# Patient Record
Sex: Female | Born: 1965 | Race: White | Hispanic: No
Health system: Southern US, Community
[De-identification: ages and names within clinical notes are randomized; demographics above are authoritative.]

---

## 2017-01-20 ENCOUNTER — Emergency Department (HOSPITAL_COMMUNITY): Payer: Medicare Other

## 2017-01-20 ENCOUNTER — Emergency Department (HOSPITAL_COMMUNITY)
Admission: EM | Admit: 2017-01-20 | Discharge: 2017-01-20 | Disposition: A | Discharge status: Home / Self Care | Payer: Medicare Other | Attending: Emergency Medicine | Admitting: Emergency Medicine

## 2017-01-20 DIAGNOSIS — Z79899 Other long term (current) drug therapy: Secondary | ICD-10-CM | POA: Insufficient documentation

## 2017-01-20 DIAGNOSIS — J40 Bronchitis, not specified as acute or chronic: Secondary | ICD-10-CM | POA: Insufficient documentation

## 2017-01-20 DIAGNOSIS — R05 Cough: Secondary | ICD-10-CM | POA: Diagnosis present

## 2017-01-20 LAB — URINALYSIS, ROUTINE W REFLEX MICROSCOPIC
Bilirubin Urine: NEGATIVE
Glucose, UA: NEGATIVE mg/dL
Ketones, ur: NEGATIVE mg/dL
LEUKOCYTES UA: NEGATIVE
Nitrite: NEGATIVE
PH: 6 (ref 5.0–8.0)
Protein, ur: NEGATIVE mg/dL
SPECIFIC GRAVITY, URINE: 1.01 (ref 1.005–1.030)

## 2017-01-20 LAB — CBC WITH DIFFERENTIAL/PLATELET
BASOS PCT: 0 %
Basophils Absolute: 0 10*3/uL (ref 0.0–0.1)
Eosinophils Absolute: 0.1 10*3/uL (ref 0.0–0.7)
Eosinophils Relative: 1 %
HEMATOCRIT: 44.6 % (ref 36.0–46.0)
HEMOGLOBIN: 14.7 g/dL (ref 12.0–15.0)
LYMPHS ABS: 2.3 10*3/uL (ref 0.7–4.0)
Lymphocytes Relative: 19 %
MCH: 27.2 pg (ref 26.0–34.0)
MCHC: 33 g/dL (ref 30.0–36.0)
MCV: 82.4 fL (ref 78.0–100.0)
MONOS PCT: 10 %
Monocytes Absolute: 1.2 10*3/uL — ABNORMAL HIGH (ref 0.1–1.0)
NEUTROS ABS: 8.5 10*3/uL — AB (ref 1.7–7.7)
NEUTROS PCT: 70 %
Platelets: 251 10*3/uL (ref 150–400)
RBC: 5.41 MIL/uL — AB (ref 3.87–5.11)
RDW: 16.4 % — ABNORMAL HIGH (ref 11.5–15.5)
WBC: 12 10*3/uL — AB (ref 4.0–10.5)

## 2017-01-20 LAB — COMPREHENSIVE METABOLIC PANEL
ALBUMIN: 3.5 g/dL (ref 3.5–5.0)
ALK PHOS: 84 U/L (ref 38–126)
ALT: 24 U/L (ref 14–54)
ANION GAP: 8 (ref 5–15)
AST: 19 U/L (ref 15–41)
BUN: 12 mg/dL (ref 6–20)
CALCIUM: 9 mg/dL (ref 8.9–10.3)
CO2: 31 mmol/L (ref 22–32)
CREATININE: 0.89 mg/dL (ref 0.44–1.00)
Chloride: 101 mmol/L (ref 101–111)
GFR calc Af Amer: >60 mL/min (ref >60)
GFR calc non Af Amer: >60 mL/min (ref >60)
GLUCOSE: 113 mg/dL — AB (ref 65–99)
Potassium: 3.3 mmol/L — ABNORMAL LOW (ref 3.5–5.1)
Sodium: 140 mmol/L (ref 135–145)
Total Bilirubin: 0.5 mg/dL (ref 0.3–1.2)
Total Protein: 7 g/dL (ref 6.5–8.1)

## 2017-01-20 MED ORDER — ALBUTEROL SULFATE (2.5 MG/3ML) 0.083% IN NEBU
5.0000 mg | INHALATION_SOLUTION | Freq: Once | RESPIRATORY_TRACT | Status: AC
  Administered 2017-01-20: 5 mg via RESPIRATORY_TRACT
  Filled 2017-01-20: qty 6

## 2017-01-20 MED ORDER — ALBUTEROL SULFATE (2.5 MG/3ML) 0.083% IN NEBU: 2.5000 mg | INHALATION_SOLUTION | Freq: Once | RESPIRATORY_TRACT | Status: DC

## 2017-01-20 MED ORDER — PREDNISONE 10 MG PO TABS: 20.0000 mg | ORAL_TABLET | Freq: Every day | ORAL | 0 refills | Status: AC

## 2017-01-20 MED ORDER — HYDROCODONE-ACETAMINOPHEN 5-325 MG PO TABS
1.0000 | ORAL_TABLET | Freq: Once | ORAL | Status: AC
  Administered 2017-01-20: 1 via ORAL
  Filled 2017-01-20: qty 1

## 2017-01-20 MED ORDER — SULFAMETHOXAZOLE-TRIMETHOPRIM 800-160 MG PO TABS: 1.0000 | ORAL_TABLET | Freq: Two times a day (BID) | ORAL | 0 refills | Status: AC

## 2017-01-20 MED ORDER — IPRATROPIUM-ALBUTEROL 0.5-2.5 (3) MG/3ML IN SOLN
3.0000 mL | Freq: Once | RESPIRATORY_TRACT | Status: AC
  Administered 2017-01-20: 3 mL via RESPIRATORY_TRACT
  Filled 2017-01-20: qty 3

## 2017-01-20 MED ORDER — PREDNISONE 50 MG PO TABS
60.0000 mg | ORAL_TABLET | Freq: Once | ORAL | Status: AC
  Administered 2017-01-20: 60 mg via ORAL
  Filled 2017-01-20: qty 1

## 2017-01-20 NOTE — ED Notes (Signed)
Lab at bedside

## 2017-01-20 NOTE — ED Notes (Signed)
ED Provider at bedside. 

## 2017-01-20 NOTE — ED Triage Notes (Signed)
Pt reports that she has been coughing and congested for one week. Pt reports that she has mold in her house and it was treated last with bleach and since exposure she has been sick Chest and ribs hurt from coughing. Pt also reports that she has only voided x 2 and it burns

## 2017-01-20 NOTE — Discharge Instructions (Signed)
Continue use your inhaler as prescribed. Follow-up with your doctor next week as planned.

## 2017-01-20 NOTE — ED Provider Notes (Signed)
[facility] Provider Note   CSN: [id number] Arrival date & time: 01/20/17  1306     History   Chief Complaint Chief Complaint  Patient presents with  . Cough    HPI Teresa Andrade is a 51 y.o. female.  Patient complains of cough for a number days. She also complains of some shortness of breath   The history is provided by the patient. No language interpreter was used.  Cough  This is a recurrent problem. The current episode started more than 2 days ago. The problem occurs constantly. The problem has not changed since onset.The cough is non-productive. The maximum temperature recorded prior to her arrival was 103 to 104 F. Pertinent negatives include no chest pain and no headaches. She has tried nothing for the symptoms. The treatment provided no relief. Risk factors include chemical exposure. She is a smoker. Her past medical history does not include bronchiectasis.    No past medical history on file.  There are no active problems to display for this patient.   No past surgical history on file.  OB History    No data available       Home Medications    Prior to Admission medications   Medication Sig Start Date End Date Taking? Authorizing Provider  albuterol (PROVENTIL HFA;VENTOLIN HFA) 108 (90 Base) MCG/ACT inhaler Inhale 1-2 puffs into the lungs every 6 (six) hours as needed for wheezing or shortness of breath.   Yes [provider]  buprenorphine (SUBUTEX) 8 MG SUBL SL tablet See admin instructions. Place one tablet under tongue 3 times daily & 1/2 tablet at bedtime 01/07/17  Yes [provider]  clonazePAM (KLONOPIN) 1 MG tablet Take 1 mg by mouth 3 (three) times daily. 01/07/17  Yes [provider]  DULoxetine (CYMBALTA) 60 MG capsule Take 2 capsules by mouth daily. 01/19/17  Yes [provider]  furosemide (LASIX) 40 MG tablet Take 1 tablet by mouth daily.   Yes [provider]  gabapentin (NEURONTIN) 800 MG tablet  Take 800 mg by mouth 4 (four) times daily. 01/19/17  Yes [provider]  hydrOXYzine (VISTARIL) 50 MG capsule Take 1 capsule by mouth See admin instructions. 1 capsule three times daily and 2 capsules at bedtim 01/19/17  Yes [provider]  IBU 800 MG tablet Take 800 mg by mouth 3 (three) times daily. 01/19/17  Yes [provider]  lubiprostone (AMITIZA) 24 MCG capsule Take 1 capsule by mouth 2 (two) times daily.   Yes [provider]  LYRICA 300 MG capsule Take 300 mg by mouth 2 (two) times daily. 01/19/17  Yes [provider]  mirtazapine (REMERON) 30 MG tablet Take 60 mg by mouth at bedtime. 01/19/17  Yes [provider]  montelukast (SINGULAIR) 10 MG tablet Take 10 mg by mouth at bedtime.   Yes [provider]  VRAYLAR 1.5 MG CAPS Take 1 capsule by mouth at bedtime. 01/14/17  Yes [provider]  VRAYLAR 3 MG CAPS Take 1 capsule by mouth at bedtime. 01/19/17  Yes [provider]  Cholecalciferol (VITAMIN D) 2000 units tablet Take 1 tablet by mouth daily.    [provider]  dicyclomine (BENTYL) 20 MG tablet Take 1 tablet by mouth 3 (three) times daily.    [provider]  potassium chloride SA (K-DUR,KLOR-CON) 20 MEQ tablet Take 1 tablet by mouth daily as needed.    [provider]  pravastatin (PRAVACHOL) 40 MG tablet Take 1 tablet by  mouth at bedtime.    [provider]  predniSONE (DELTASONE) 10 MG tablet Take 2 tablets (20 mg total) by mouth daily. 01/20/17   Bethann BerkshireZammit, Vicki Pasqual, MD  sulfamethoxazole-trimethoprim (BACTRIM DS,SEPTRA DS) 800-160 MG tablet Take 1 tablet by mouth 2 (two) times daily. 01/20/17 01/27/17  Bethann BerkshireZammit, Shanae Luo, MD    Family History No family history on file.  Social History Social History  Substance Use Topics  . Smoking status: Not on file  . Smokeless tobacco: Not on file  . Alcohol use Not on file     Allergies   Codeine   Review of Systems Review of Systems   Constitutional: Negative for appetite change and fatigue.  HENT: Negative for congestion, ear discharge and sinus pressure.   Eyes: Negative for discharge.  Respiratory: Positive for cough.   Cardiovascular: Negative for chest pain.  Gastrointestinal: Negative for abdominal pain and diarrhea.  Genitourinary: Negative for frequency and hematuria.  Musculoskeletal: Negative for back pain.  Skin: Negative for rash.  Neurological: Negative for seizures and headaches.  Psychiatric/Behavioral: Negative for hallucinations.     Physical Exam Updated Vital Signs BP 136/77   Pulse 82   Temp 98.7 F (37.1 C) (Oral)   Resp 18   Wt 130.4 kg (287 lb 7 oz)   SpO2 92%   Physical Exam  Constitutional: She is oriented to person, place, and time. She appears well-developed.  HENT:  Head: Normocephalic.  Eyes: Conjunctivae and EOM are normal. No scleral icterus.  Neck: Neck supple. No thyromegaly present.  Cardiovascular: Normal rate and regular rhythm.  Exam reveals no gallop and no friction rub.   No murmur heard. Pulmonary/Chest: No stridor. She has wheezes. She has no rales. She exhibits no tenderness.  Abdominal: She exhibits no distension. There is no tenderness. There is no rebound.  Musculoskeletal: Normal range of motion. She exhibits no edema.  Lymphadenopathy:    She has no cervical adenopathy.  Neurological: She is oriented to person, place, and time. She exhibits normal muscle tone. Coordination normal.  Skin: No rash noted. No erythema.  Psychiatric: She has a normal mood and affect. Her behavior is normal.     ED Treatments / Results  Labs (all labs ordered are listed, but only abnormal results are displayed) Labs Reviewed  CBC WITH DIFFERENTIAL/PLATELET - Abnormal; Notable for the following:       Result Value   WBC 12.0 (*)    RBC 5.41 (*)    RDW 16.4 (*)    Neutro Abs 8.5 (*)    Monocytes Absolute 1.2 (*)    All other components within normal limits    COMPREHENSIVE METABOLIC PANEL - Abnormal; Notable for the following:    Potassium 3.3 (*)    Glucose, Bld 113 (*)    All other components within normal limits  URINALYSIS, ROUTINE W REFLEX MICROSCOPIC - Abnormal; Notable for the following:    Hgb urine dipstick SMALL (*)    Bacteria, UA RARE (*)    Squamous Epithelial / LPF 0-5 (*)    All other components within normal limits    EKG  EKG Interpretation None       Radiology Dg Chest 2 View  Result Date: 01/20/2017 CLINICAL DATA:  Cough and congested. EXAM: CHEST  2 VIEW COMPARISON:  No recent prior . FINDINGS: Mediastinum hilar structures normal. Low lung volumes with mild basilar atelectasis. No pleural effusion or pneumothorax. Heart size normal. No acute bony abnormality . IMPRESSION: Low lung volumes with mild  basilar atelectasis. Electronically Signed   By: Maisie Fus  Register   On: 01/20/2017 14:02    Procedures Procedures (including critical care time)  Medications Ordered in ED Medications  albuterol (PROVENTIL) (2.5 MG/3ML) 0.083% nebulizer solution 5 mg (5 mg Nebulization Given 01/20/17 1405)  predniSONE (DELTASONE) tablet 60 mg (60 mg Oral Given 01/20/17 1430)  ipratropium-albuterol (DUONEB) 0.5-2.5 (3) MG/3ML nebulizer solution 3 mL (3 mLs Nebulization Given 01/20/17 1405)  HYDROcodone-acetaminophen (NORCO/VICODIN) 5-325 MG per tablet 1 tablet (1 tablet Oral Given 01/20/17 1512)     Initial Impression / Assessment and Plan / ED Course  I have reviewed the triage vital signs and the nursing notes.  Pertinent labs & imaging results that were available during my care of the patient were reviewed by me and considered in my medical decision making (see chart for details).     Patient improved with neb treatment. Patient has bronchitis and bronchospasm she'll be treated with Bactrim and prednisone will follow-up with her PCP next week  Final Clinical Impressions(s) / ED Diagnoses   Final diagnoses:  Bronchitis    New  Prescriptions New Prescriptions   PREDNISONE (DELTASONE) 10 MG TABLET    Take 2 tablets (20 mg total) by mouth daily.   SULFAMETHOXAZOLE-TRIMETHOPRIM (BACTRIM DS,SEPTRA DS) 800-160 MG TABLET    Take 1 tablet by mouth 2 (two) times daily.     Bethann Berkshire, MD 01/20/17 [phone]

## 2018-04-23 IMAGING — DX DG CHEST 2V
2 series · 2 of 2 positions shown · non-contrast
Comparison: No recent prior .

CLINICAL DATA: Cough and congested.

EXAM:
CHEST  2 VIEW

[chest pa]
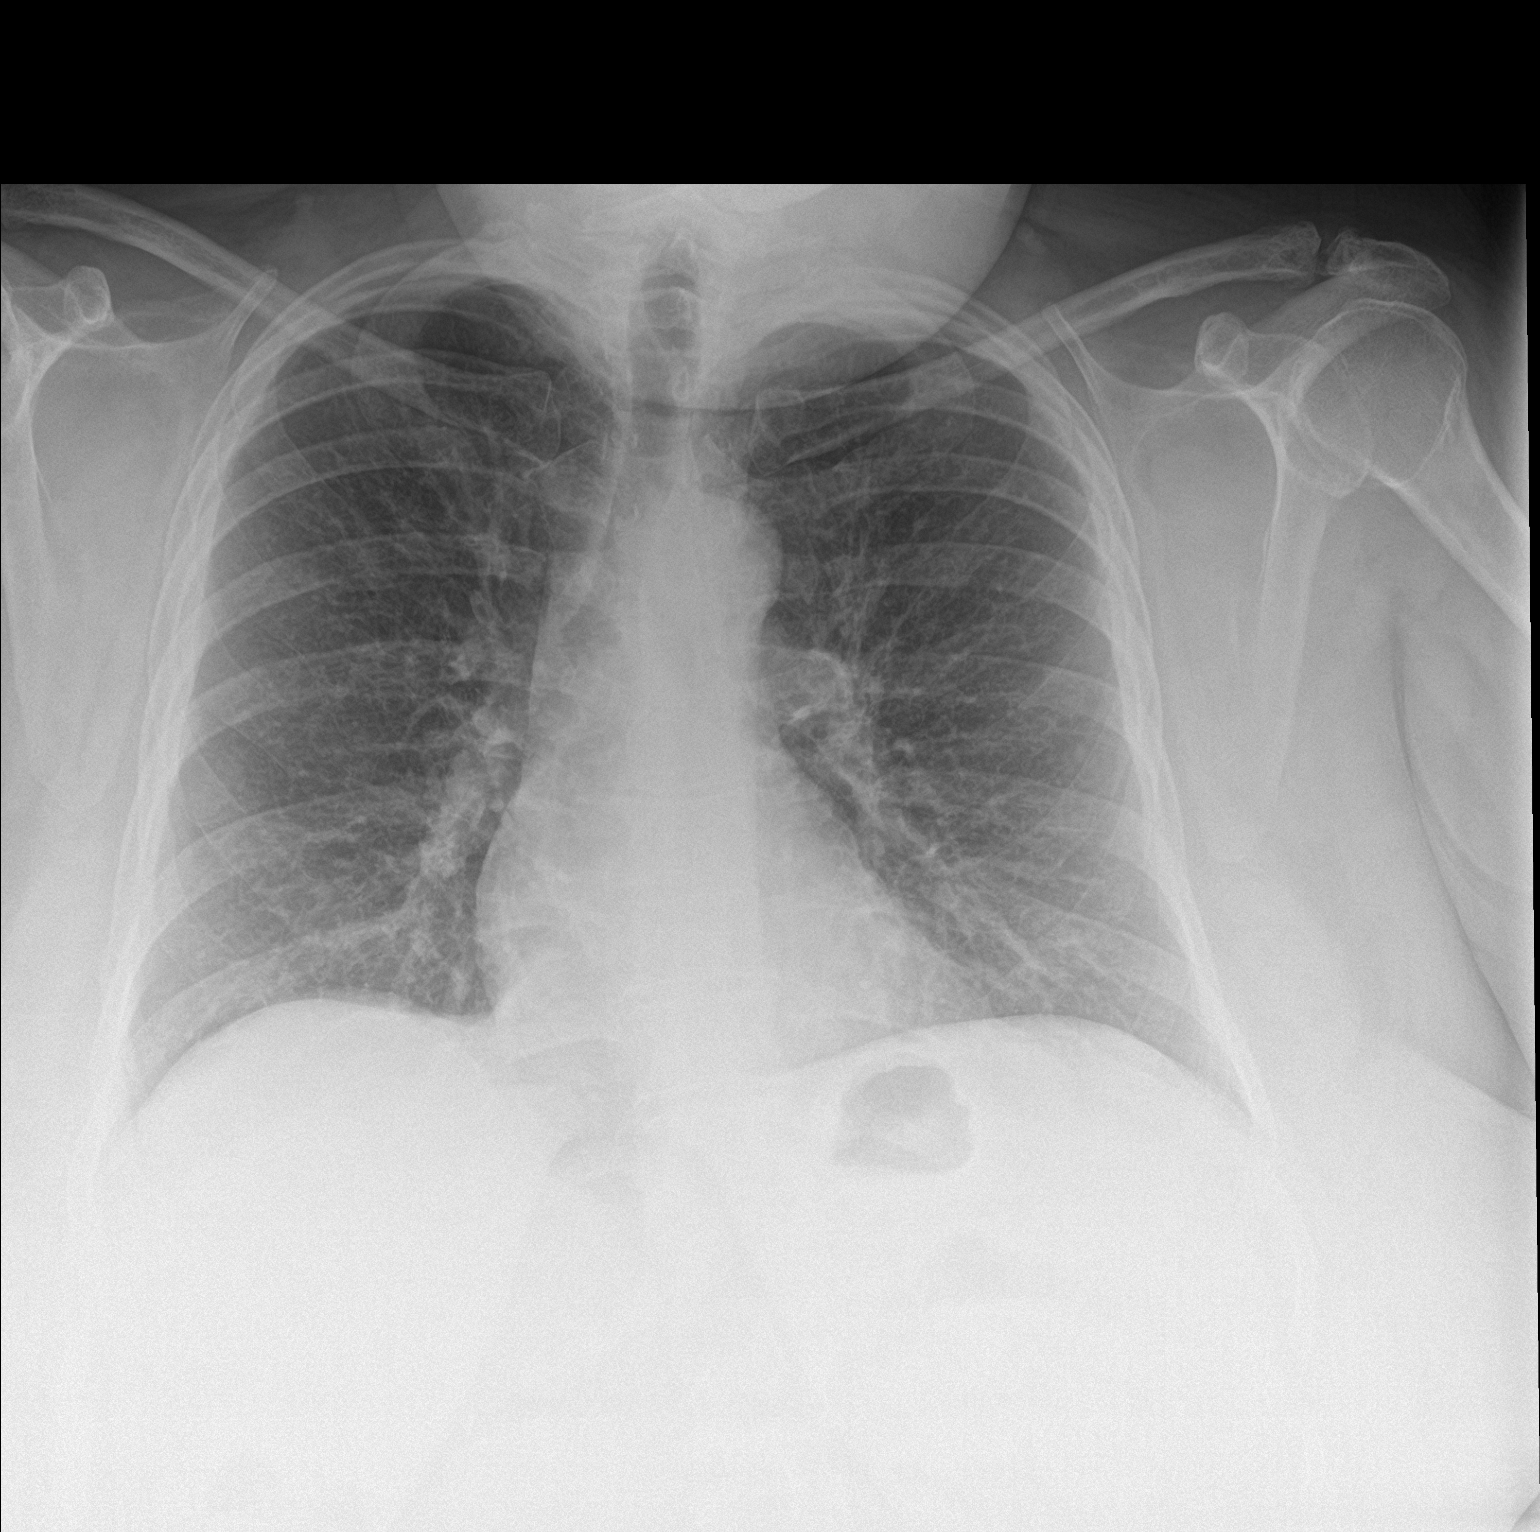

[chest lat]
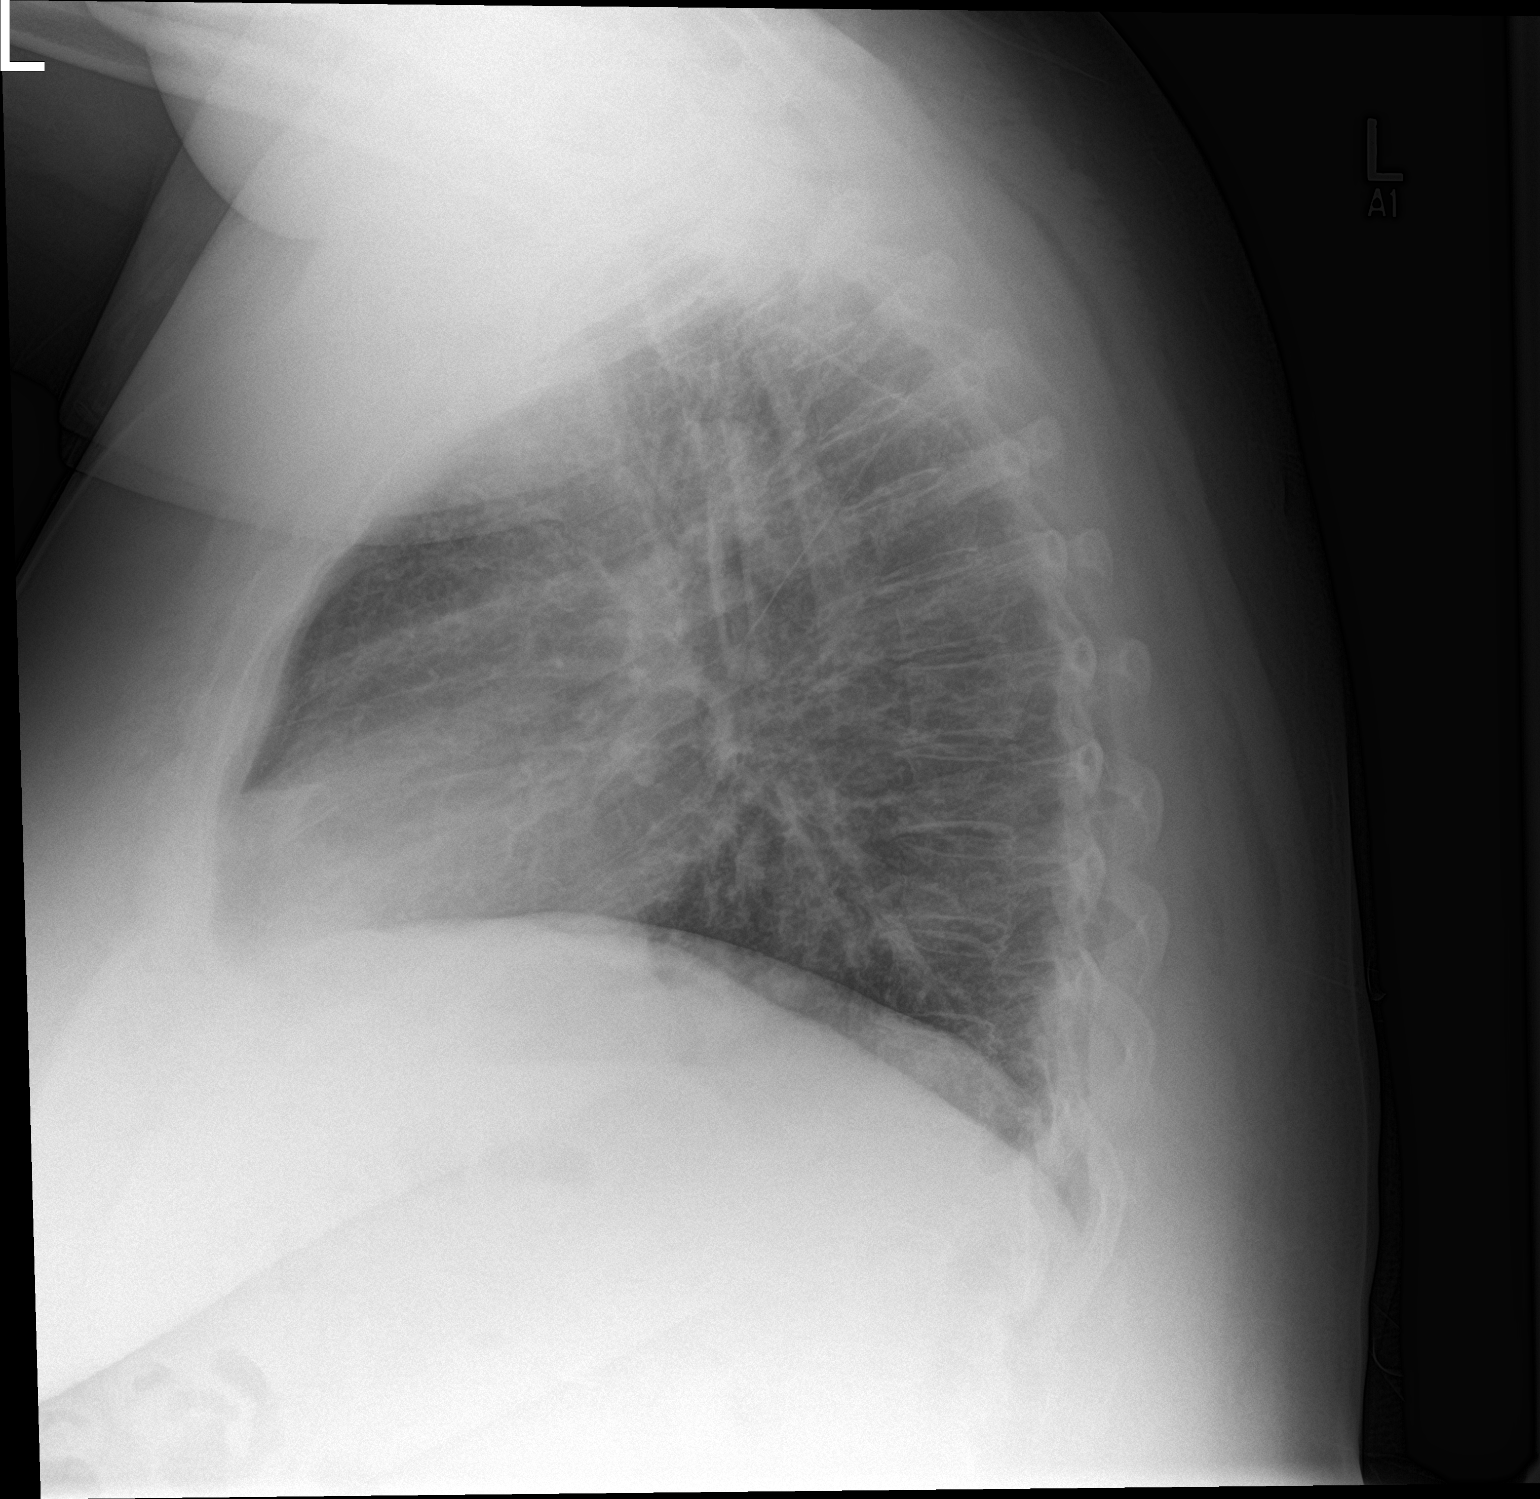

[2 of 2 positions shown; findings below may reference images not displayed]

FINDINGS: Mediastinum hilar structures normal. Low lung volumes with mild
basilar atelectasis. No pleural effusion or pneumothorax. Heart size
normal. No acute bony abnormality .
IMPRESSION: Low lung volumes with mild basilar atelectasis.
